# Patient Record
Sex: Male | Born: 1960 | Hispanic: No | Marital: Married | Smoking: Former smoker
Health system: Southern US, Community
[De-identification: ages and names within clinical notes are randomized; demographics above are authoritative.]

## PROBLEM LIST (undated history)

## (undated) HISTORY — PX: FRACTURE SURGERY: SHX138

---

## 2015-04-11 ENCOUNTER — Ambulatory Visit (INDEPENDENT_AMBULATORY_CARE_PROVIDER_SITE_OTHER): Payer: Managed Care, Other (non HMO)

## 2015-04-11 ENCOUNTER — Encounter: Payer: Self-pay | Admitting: *Deleted

## 2015-04-11 ENCOUNTER — Ambulatory Visit
Admission: EM | Admit: 2015-04-11 | Discharge: 2015-04-11 | Disposition: A | Discharge status: Home / Self Care | Payer: Managed Care, Other (non HMO) | Attending: Family Medicine | Admitting: Family Medicine

## 2015-04-11 DIAGNOSIS — S83422A Sprain of lateral collateral ligament of left knee, initial encounter: Secondary | ICD-10-CM

## 2015-04-11 MED ORDER — NAPROXEN 500 MG PO TABS: 500.0000 mg | ORAL_TABLET | Freq: Two times a day (BID) | ORAL | Status: AC

## 2015-04-11 NOTE — Discharge Instructions (Signed)
How to Use a Knee Brace A knee brace is a device that you wear to support your knee, especially if the knee is healing after an injury or surgery. There are several types of knee braces. Some are designed to prevent an injury (prophylactic brace). These are often worn during sports. Others support an injured knee (functional brace) or keep it still while it heals (rehabilitative brace). People with severe arthritis of the knee may benefit from a brace that takes some pressure off the knee (unloader brace). Most knee braces are made from a combination of cloth and metal or plastic.  You may need to wear a knee brace to:  Relieve knee pain.  Help your knee support your weight (improve stability).  Help you walk farther (improve mobility).  Prevent injury.  Support your knee while it heals from surgery or from an injury. RISKS AND COMPLICATIONS Generally, knee braces are very safe to wear. However, problems may occur, including:  Skin irritation that may lead to infection.  Making your condition worse if you wear the brace in the wrong way. HOW TO USE A KNEE BRACE Different braces will have different instructions for use. Your health care provider will tell you or show you:  How to put on your brace.  How to adjust the brace.  When and how often to wear the brace.  How to remove the brace.  If you will need any assistive devices in addition to the brace, such as crutches or a cane. In general, your brace should:  Have the hinge of the brace line up with the bend of your knee.  Have straps, hooks, or tapes that fasten snugly around your leg.  Not feel too tight or too loose. HOW TO CARE FOR A KNEE BRACE  Check your brace often for signs of damage, such as loose connections or attachments. Your knee brace may get damaged or wear out during normal use.  Wash the fabric parts of your brace with soap and water.  Read the insert that comes with your brace for other specific care  instructions. SEEK MEDICAL CARE IF:  Your knee brace is too loose or too tight and you cannot adjust it.  Your knee brace causes skin redness, swelling, bruising, or irritation.  Your knee brace is not helping.  Your knee brace is making your knee pain worse.   This information is not intended to replace advice given to you by your health care provider. Make sure you discuss any questions you have with your health care provider.   Document Released: 05/29/2003 Document Revised: 11/27/2014 Document Reviewed: 07/01/2014 Elsevier Interactive Patient Education 2016 [employer]  Knee Sprain A knee sprain is a tear in one of the strong, fibrous tissues that connect the bones (ligaments) in your knee. The severity of the sprain depends on how much of the ligament is torn. The tear can be either partial or complete. CAUSES  Often, sprains are a result of a fall or injury. The force of the impact causes the fibers of your ligament to stretch too much. This excess tension causes the fibers of your ligament to tear. SIGNS AND SYMPTOMS  You may have some loss of motion in your knee. Other symptoms include:  Bruising.  Pain in the knee area.  Tenderness of the knee to the touch.  Swelling. DIAGNOSIS  To diagnose a knee sprain, your health care provider will physically examine your knee. Your health care provider may also suggest an X-ray exam  of your knee to make sure no bones are broken. °TREATMENT  °If your ligament is only partially torn, treatment usually involves keeping the knee in a fixed position (immobilization) or bracing your knee for activities that require movement for several weeks. To do this, your health care provider will apply a bandage, cast, or splint to keep your knee from moving and to support your knee during movement until it heals. For a partially torn ligament, the healing process usually takes 4-6 weeks. °If your ligament is completely torn, depending on which  ligament it is, you may need surgery to reconnect the ligament to the bone or reconstruct it. After surgery, a cast or splint may be applied and will need to stay on your knee for 4-6 weeks while your ligament heals. °HOME CARE INSTRUCTIONS °· Keep your injured knee elevated to decrease swelling. °· To ease pain and swelling, apply ice to the injured area: °¨ Put ice in a plastic bag. °¨ Place a towel between your skin and the bag. °¨ Leave the ice on for 20 minutes, 2-3 times a day. °· Only take medicine for pain as directed by your health care provider. °· Do not leave your knee unprotected until pain and stiffness go away (usually 4-6 weeks). °· If you have a cast or splint, do not allow it to get wet. If you have been instructed not to remove it, cover it with a plastic bag when you shower or bathe. Do not swim. °· Your health care provider may suggest exercises for you to do during your recovery to prevent or limit permanent weakness and stiffness. °SEEK IMMEDIATE MEDICAL CARE IF: °· Your cast or splint becomes damaged. °· Your pain becomes worse. °· You have significant pain, swelling, or numbness below the cast or splint. °MAKE SURE YOU: °· Understand these instructions. °· Will watch your condition. °· Will get help right away if you are not doing well or get worse. °  °This information is not intended to replace advice given to you by your health care provider. Make sure you discuss any questions you have with your health care provider. °  °Document Released: 03/08/2005 Document Revised: 03/29/2014 Document Reviewed: 10/18/2012 °Elsevier Interactive Patient Education ©2016 [employer] ° °

## 2015-04-11 NOTE — ED Provider Notes (Signed)
CSN: [id number]     Arrival date & time 04/11/15  1908 History   First MD Initiated Contact with Patient 04/11/15 2003     Chief Complaint  Patient presents with  . Knee Pain    left side   (Consider location/radiation/quality/duration/timing/severity/associated sxs/prior Treatment) HPI   55 year old male who presents with left lateral knee pain being injury this Sunday at [facility] he states that he fell down in his extremity was forced into a varus stress with resulting swelling and pain of his left knee. He denies any locking or clicking or popping. Patient has improved over time but he states whenever he walks he feels that unsteadiness in the knee. Never hurt that knee in the past.  History reviewed. No pertinent past medical history. Past Surgical History  Procedure Laterality Date  . Fracture surgery Left     elbow   Family History  Problem Relation Age of Onset  . Hypertension Mother   . Pneumonia Father    Social History  Substance Use Topics  . Smoking status: Former Games developer  . Smokeless tobacco: Never Used  . Alcohol Use: Yes     Comment: socially    Review of Systems  Constitutional: Positive for activity change. Negative for fever and fatigue.  Musculoskeletal: Positive for joint swelling and gait problem.    Allergies  Review of patient's allergies indicates no known allergies.  Home Medications   Prior to Admission medications   Medication Sig Start Date End Date Taking? Authorizing Provider  naproxen (NAPROSYN) 500 MG tablet Take 1 tablet (500 mg total) by mouth 2 (two) times daily with a meal. 04/11/15   Lutricia Feil, PA-C   Meds Ordered and Administered this Visit  Medications - No data to display  BP 132/83 mmHg  Pulse 58  Temp(Src) 97.6 F (36.4 C) (Oral)  Resp 18  Ht 6' (1.829 m)  Wt 158 lb 11.7 oz (72 kg)  BMI 21.52 kg/m2  SpO2 100% No data found.   Physical Exam  Constitutional: He is oriented to person, place, and  time. He appears well-developed and well-nourished. No distress.  HENT:  Head: Normocephalic and atraumatic.  Eyes: Conjunctivae are normal. Pupils are equal, round, and reactive to light.  Neck: Normal range of motion. Neck supple.  Musculoskeletal: He exhibits edema and tenderness.  Examination of the left knee shows a 1+ effusion. Motion lacks approximately  10 degrees of full flexion. Quadriceps is strong with good control. There is some laxity to the lateral collateral ligament on the left. But does have a definite endpoint. There is no laxity corresponding on the medial collateral ligament. Negative drawer sign and negative McMurray sign. There is no patellofemoral tenderness or apprehension. This is maximal at the proximal end of the lateral collateral ligament and less so distally. There is no tenderness over the medial collateral ligament.  Neurological: He is alert and oriented to person, place, and time.  Skin: Skin is warm and dry. He is not diaphoretic.  Psychiatric: He has a normal mood and affect. His behavior is normal. Judgment and thought content normal.  Nursing note and vitals reviewed.   ED Course  Procedures (including critical care time)  Labs Review Labs Reviewed - No data to display  Imaging Review Dg Knee Complete 4 Views Left  04/11/2015  CLINICAL DATA:  Left lateral knee pain after twisting injury while skiing. Initial encounter. EXAM: LEFT KNEE - COMPLETE 4+ VIEW COMPARISON:  None. FINDINGS: There is no  evidence of fracture or dislocation. The joint spaces are preserved. No significant degenerative change is seen; the patellofemoral joint is grossly unremarkable in appearance. A small to moderate knee joint effusion is noted. The visualized soft tissues are otherwise unremarkable in appearance. IMPRESSION: 1. No evidence of fracture or dislocation. 2. Small to moderate knee joint effusion noted. MRI could be considered for further evaluation, if the patient's  symptoms persist. Electronically Signed   By: Roanna Raider M.D.   On: 04/11/2015 20:53     Visual Acuity Review  Right Eye Distance:   Left Eye Distance:   Bilateral Distance:    Right Eye Near:   Left Eye Near:    Bilateral Near:      A knee immobilizer was applied to the left knee prior to discharge   MDM   1. Lateral collateral ligament sprain of knee, left, initial encounter    Discharge Medication List as of 04/11/2015  9:07 PM    START taking these medications   Details  naproxen (NAPROSYN) 500 MG tablet Take 1 tablet (500 mg total) by mouth 2 (two) times daily with a meal., Starting 04/11/2015, Until Discontinued, Print      Plan: 1. Test/x-ray results and diagnosis reviewed with patient 2. rx as per orders; risks, benefits, potential side effects reviewed with patient 3. Recommend supportive treatment with symptoms of avoidance. Also trained the patient in quadricep isometric exercises. Use the brace for use when he is walking or active on his leg. I told that he may remove it at nighttime inactivity periods and certainly during bedtime. It is not improving he should follow-up with an orthopedic surgeon. I told him that think that the injury is isolated to the origin of the lateral collateral ligament. 4. F/u prn if symptoms worsen or don't improve     Lutricia Feil, PA-C 04/11/15 2112

## 2015-04-11 NOTE — ED Notes (Signed)
Patient was skiing this past Sunday and twisted his left knee. Swelling is present.

## 2017-03-14 IMAGING — CR DG KNEE COMPLETE 4+V*L*
4 series · 4 of 4 positions shown · non-contrast
Comparison: None.

CLINICAL DATA: Left lateral knee pain after twisting injury while
skiing. Initial encounter.

EXAM:
LEFT KNEE - COMPLETE 4+ VIEW

[knee ap (1 of 3)]
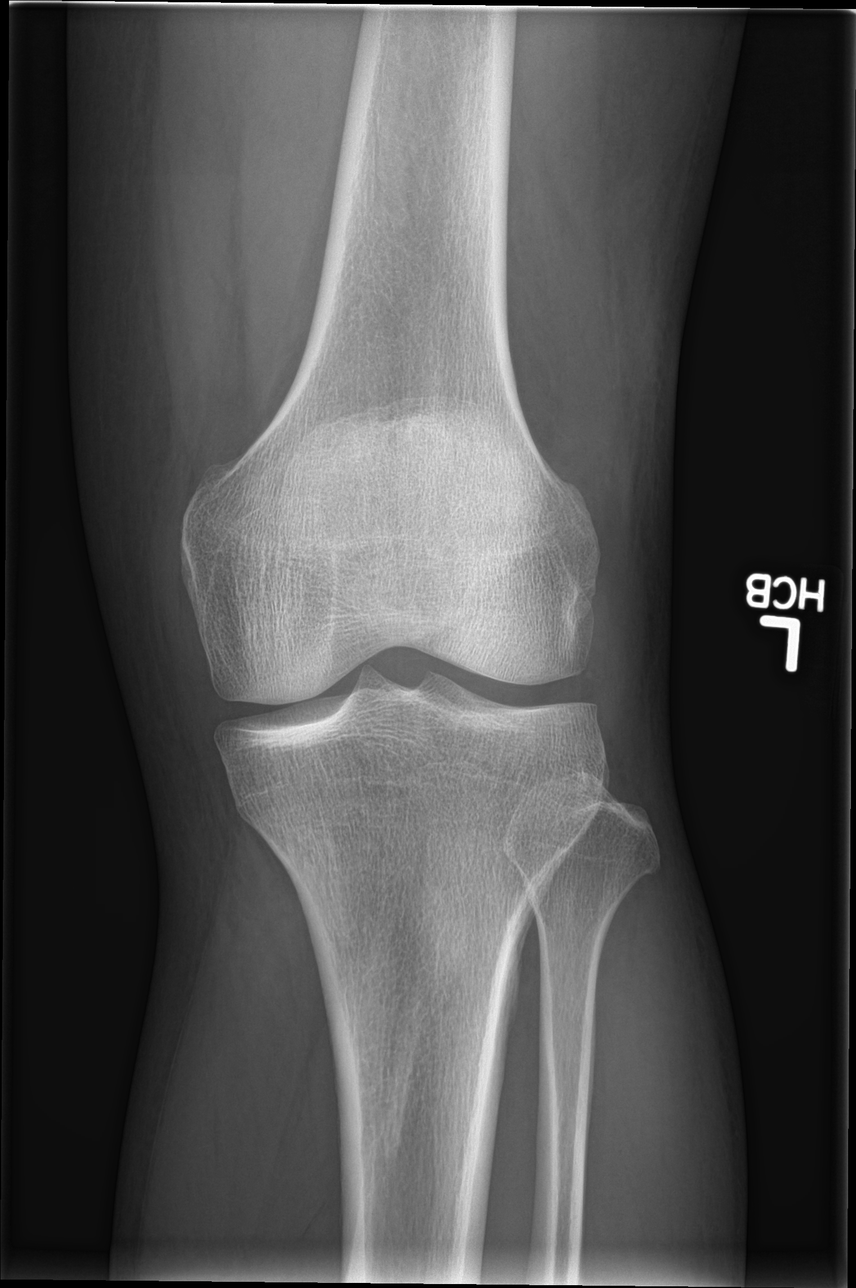

[knee lat]
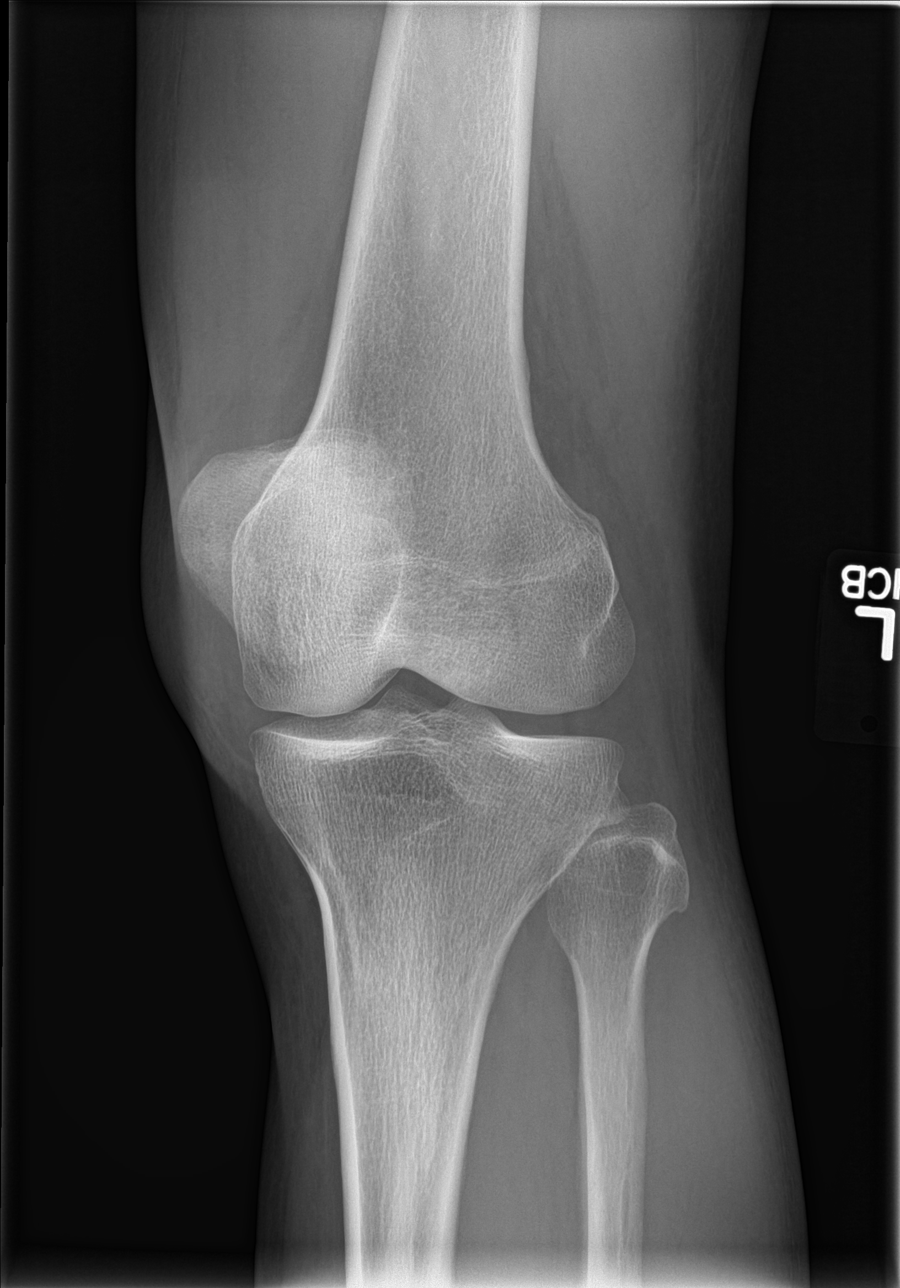

[knee ap (2 of 3)]
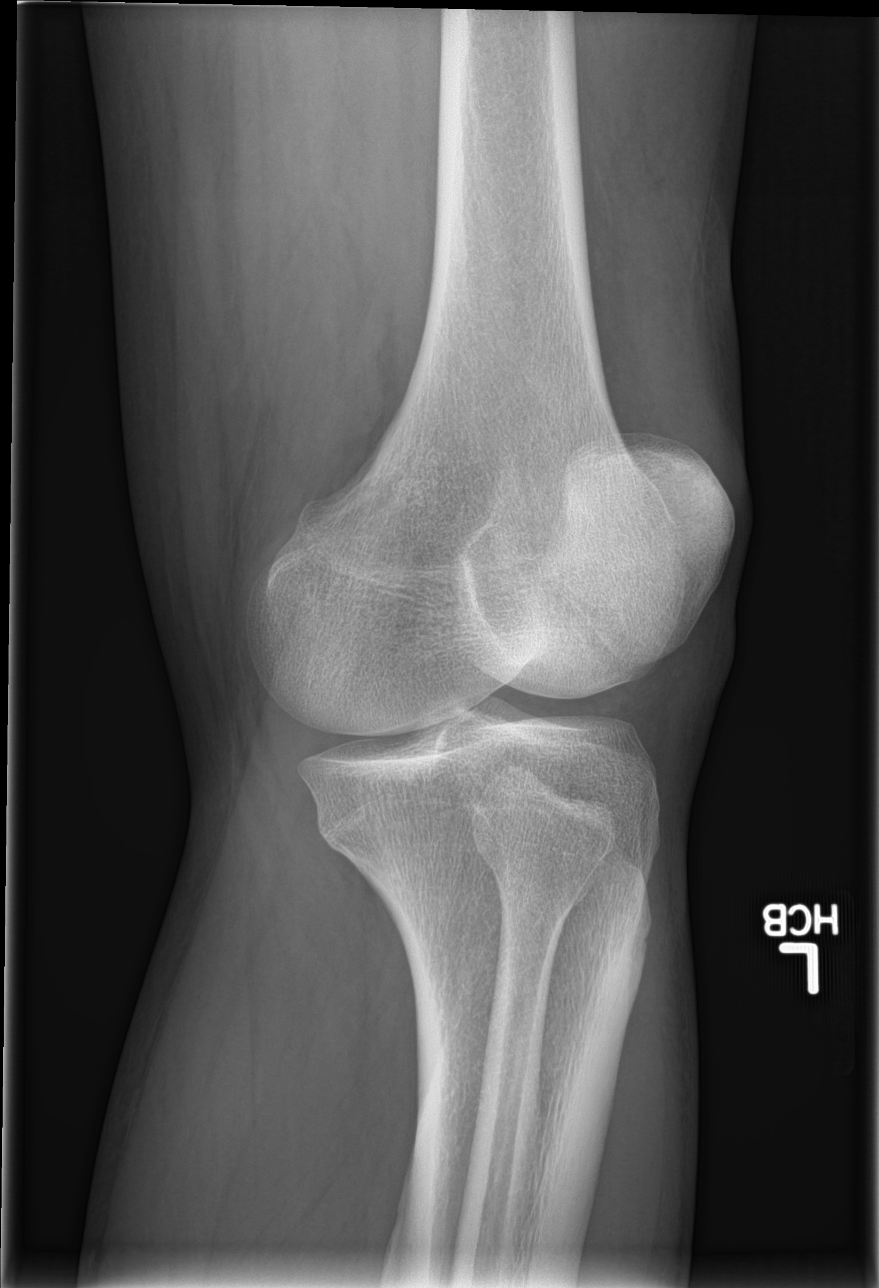

[knee ap (3 of 3)]
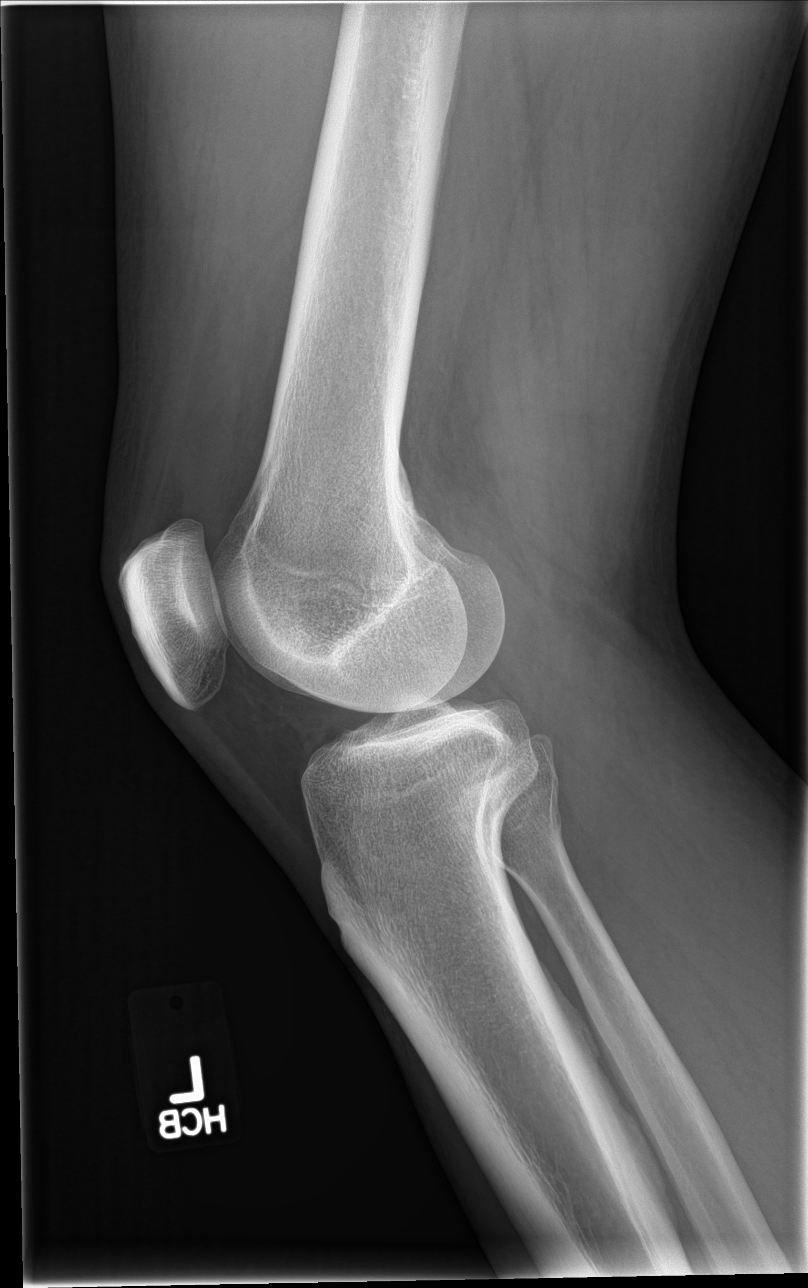

[4 of 4 positions shown; findings below may reference images not displayed]

FINDINGS: There is no evidence of fracture or dislocation. The joint spaces
are preserved. No significant degenerative change is seen; the
patellofemoral joint is grossly unremarkable in appearance.

A small to moderate knee joint effusion is noted. The visualized
soft tissues are otherwise unremarkable in appearance.
IMPRESSION: 1. No evidence of fracture or dislocation.
2. Small to moderate knee joint effusion noted. MRI could be
considered for further evaluation, if the patient's symptoms
persist.
